# Patient Record
Sex: Female | Born: 1975 | Race: White | Hispanic: Yes | Marital: Single | Smoking: Never smoker
Health system: Southern US, Community
[De-identification: ages and names within clinical notes are randomized; demographics above are authoritative.]

## PROBLEM LIST (undated history)

## (undated) DIAGNOSIS — R87629 Unspecified abnormal cytological findings in specimens from vagina: Secondary | ICD-10-CM

## (undated) HISTORY — DX: Unspecified abnormal cytological findings in specimens from vagina: R87.629

---

## 2012-12-18 ENCOUNTER — Emergency Department: Payer: Self-pay | Admitting: Emergency Medicine

## 2012-12-18 LAB — URINALYSIS, COMPLETE
Bilirubin,UR: NEGATIVE
Glucose,UR: NEGATIVE mg/dL (ref 0–75)
Nitrite: NEGATIVE
Protein: 30
Squamous Epithelial: 11

## 2012-12-18 LAB — CBC
HCT: 41.6 % (ref 35.0–47.0)
HGB: 14 g/dL (ref 12.0–16.0)
MCHC: 33.6 g/dL (ref 32.0–36.0)
MCV: 88 fL (ref 80–100)
RBC: 4.71 10*6/uL (ref 3.80–5.20)
WBC: 8.3 10*3/uL (ref 3.6–11.0)

## 2012-12-20 ENCOUNTER — Emergency Department: Payer: Self-pay | Admitting: Emergency Medicine

## 2012-12-20 LAB — URINALYSIS, COMPLETE
Bilirubin,UR: NEGATIVE
Nitrite: NEGATIVE
Protein: 100
RBC,UR: 1642 /HPF (ref 0–5)
Specific Gravity: 1.026 (ref 1.003–1.030)
Squamous Epithelial: 4

## 2012-12-20 LAB — CBC
HGB: 14.1 g/dL (ref 12.0–16.0)
MCHC: 33.9 g/dL (ref 32.0–36.0)
MCV: 89 fL (ref 80–100)
Platelet: 251 10*3/uL (ref 150–440)
RDW: 12.4 % (ref 11.5–14.5)
WBC: 10.2 10*3/uL (ref 3.6–11.0)

## 2012-12-20 LAB — HCG, QUANTITATIVE, PREGNANCY: Beta Hcg, Quant.: 1595 m[IU]/mL — ABNORMAL HIGH

## 2012-12-21 LAB — PATHOLOGY REPORT

## 2014-01-27 ENCOUNTER — Ambulatory Visit: Payer: Self-pay | Admitting: Advanced Practice Midwife

## 2014-02-07 ENCOUNTER — Ambulatory Visit: Payer: Self-pay | Admitting: Advanced Practice Midwife

## 2014-03-10 ENCOUNTER — Ambulatory Visit: Payer: Self-pay | Admitting: Advanced Practice Midwife

## 2014-03-21 DIAGNOSIS — O24410 Gestational diabetes mellitus in pregnancy, diet controlled: Secondary | ICD-10-CM | POA: Insufficient documentation

## 2020-10-21 ENCOUNTER — Encounter: Payer: Self-pay | Admitting: Physician Assistant

## 2020-10-21 ENCOUNTER — Ambulatory Visit: Payer: Self-pay

## 2020-10-21 ENCOUNTER — Other Ambulatory Visit: Payer: Self-pay

## 2020-10-21 ENCOUNTER — Ambulatory Visit (LOCAL_COMMUNITY_HEALTH_CENTER): Payer: Self-pay | Admitting: Physician Assistant

## 2020-10-21 VITALS — BP 117/76 | Ht 61.0 in | Wt 205.6 lb

## 2020-10-21 DIAGNOSIS — Z3046 Encounter for surveillance of implantable subdermal contraceptive: Secondary | ICD-10-CM

## 2020-10-21 DIAGNOSIS — Z3009 Encounter for other general counseling and advice on contraception: Secondary | ICD-10-CM

## 2020-10-21 DIAGNOSIS — Z0000 Encounter for general adult medical examination without abnormal findings: Secondary | ICD-10-CM

## 2020-10-21 DIAGNOSIS — Z30011 Encounter for initial prescription of contraceptive pills: Secondary | ICD-10-CM

## 2020-10-21 MED ORDER — NORGESTIMATE-ETH ESTRADIOL 0.25-35 MG-MCG PO TABS: 1.0000 | ORAL_TABLET | Freq: Every day | ORAL | 13 refills | Status: DC

## 2020-10-21 NOTE — Progress Notes (Signed)
Pt here for physical and Nexplanon removal. Pt reports she is interested in switching to birth control pills. Nexplanon placed at [facility] 10/16/2017 per [facility] records and per pt. Pt reports no issues with Nexplanon. Pt reports last sex 10/14/2020 without a condom and counseled pt that sperm can live in the body for up to 7 days and as soon as nexplanon is removed the hormone that was there preventing pregnancy will be gone and there could be a potential risk for pregnancy with her last unprotected sex and pt states understanding and would like to discuss this with the provider but is still thinking she would like the removal today and switch to pills even with risk of pregnancy. Sadie Haber, PA notified. RN counseling for Nexplanon removal completed and consent form reviewed and signed by pt. Pt states understanding of counseling.

## 2020-10-21 NOTE — Progress Notes (Signed)
Pt received Sprintec #12 packs per provider order due to expiration date. Counseled pt regarding expiration date of 11/09/2021 and that she should finish pills prior to expiration date but that if for some reason she still has pills on or after 11/09/2021 she should not take them and give [place] a call so she can RTC for more pills and pt states understanding of counseling. Pt aware that we still owe her a pack of pills and that she needs to give [place] a call when she first opens up her last pack of pills so she can RTC prior to running out of pills and pt states understanding. RN counseling for birth control pills completed and consent form reviewed and signed by pt. Pt aware to either not have sex or use condoms every time for the next 10 days and to do a home pregnancy test in 1 week per Sadie Haber, PA verbal order, and if pregnancy test is positive to stop her birth control pills and give [place] a call so she can RTC, and if it's negative she should be fine to continue birth control pills. Counseled pt per provider orders and pt states understanding of counseling. Provider orders completed.

## 2020-10-21 NOTE — Progress Notes (Signed)
[facility] DEPARTMENT [facility] [address] Main Number: [phone]    Family Planning Visit- Initial Visit  Subjective:  Sabrina Wagner is a 45 y.o.  [id number]   being seen today for an initial well woman visit and to discuss family planning options.  She is currently using Nexplanon for pregnancy prevention. Patient reports she does not want a pregnancy in the next year.  Patient has the following medical conditions has GDM, class A1 on their problem list.  Chief Complaint  Patient presents with  . Contraception    Physical and Nexplanon removal and switch birth control    Patient reports that she would like to have her Nexplanon removed and start OCs.  Reports that she was previously on OCs with 2 colors without problems but does not remember the name of the pills or the colors of the pills.  Denies problems with Nexplanon. States that she has a PCP at [facility] and they monitor blood sugar and have discussed when she needs to start getting mammograms.  Per chart review, pap due in 2024.  Patient denies any concerns today,   Body mass index is 38.85 kg/m. - Patient is eligible for diabetes screening based on BMI and age >62?  yes HA1C ordered? No, patient is monitored by PCP regularly per her report.  Patient reports 1  partner/s in last year. Desires STI screening?  No - patient declines.  Has patient been screened once for HCV in the past?  No  No results found for: HCVAB  Does the patient have current drug use (including MJ), have a partner with drug use, and/or has been incarcerated since last result? No  If yes-- Screen for HCV through [facility]   Does the patient meet criteria for HBV testing? No  Criteria:  -Household, sexual or needle sharing contact with HBV -History of drug use -HIV positive -Those with known Hep C   Health Maintenance Due  Topic Date Due  . Hepatitis C Screening  Never done  . COVID-19 Vaccine  (1) Never done  . HIV Screening  Never done  . TETANUS/TDAP  Never done  . PAP SMEAR-Modifier  Never done  . INFLUENZA VACCINE  Never done    Review of Systems  All other systems reviewed and are negative.   The following portions of the patient's history were reviewed and updated as appropriate: allergies, current medications, past family history, past medical history, past social history, past surgical history and problem list. Problem list updated.   See flowsheet for other program required questions.  Objective:   Vitals:   10/21/20 0911  BP: 117/76  Weight: 205 lb 9.6 oz (93.3 kg)  Height: 5\' 1"  (1.549 m)    Physical Exam Vitals and nursing note reviewed.  Constitutional:      General: She is not in acute distress.    Appearance: Normal appearance.  HENT:     Head: Normocephalic and atraumatic.     Mouth/Throat:     Mouth: Mucous membranes are moist.     Pharynx: Oropharynx is clear. No oropharyngeal exudate or posterior oropharyngeal erythema.  Eyes:     Conjunctiva/sclera: Conjunctivae normal.  Neck:     Thyroid: No thyroid mass, thyromegaly or thyroid tenderness.  Cardiovascular:     Rate and Rhythm: Normal rate and regular rhythm.  Pulmonary:     Effort: Pulmonary effort is normal.     Breath sounds: Normal breath sounds.  Chest:  Breasts:     Right: Normal. No mass, nipple discharge, skin change, tenderness, axillary adenopathy or supraclavicular adenopathy.     Left: Normal. No mass, nipple discharge, skin change, tenderness, axillary adenopathy or supraclavicular adenopathy.    Abdominal:     Palpations: Abdomen is soft. There is no mass.     Tenderness: There is no abdominal tenderness. There is no guarding or rebound.  Musculoskeletal:     Cervical back: Neck supple. No tenderness.  Lymphadenopathy:     Cervical: No cervical adenopathy.     Upper Body:     Right upper body: No supraclavicular, axillary or pectoral adenopathy.     Left upper  body: No supraclavicular, axillary or pectoral adenopathy.  Skin:    General: Skin is warm and dry.     Findings: No bruising, erythema, lesion or rash.  Neurological:     Mental Status: She is alert and oriented to person, place, and time.  Psychiatric:        Mood and Affect: Mood normal.        Behavior: Behavior normal.        Thought Content: Thought content normal.        Judgment: Judgment normal.       Assessment and Plan:  Sabrina Wagner is a 45 y.o. female presenting to the [facility] Department for an initial well woman exam/family planning visit  Contraception counseling: Reviewed all forms of birth control options in the tiered based approach. available including abstinence; over the counter/barrier methods; hormonal contraceptive medication including pill, patch, ring, injection,contraceptive implant, ECP; hormonal and nonhormonal IUDs; permanent sterilization options including vasectomy and the various tubal sterilization modalities. Risks, benefits, and typical effectiveness rates were reviewed.  Questions were answered.  Written information was also given to the patient to review.  Patient desires to change to OCs, this was prescribed for patient. She will follow up in  1 year and prn for surveillance.  She was told to call with any further questions, or with any concerns about this method of contraception.  Emphasized use of condoms 100% of the time for STI prevention.  Patient was not a candidate for ECP today.  1. Encounter for counseling regarding contraception Reviewed with patient that her Nexplanon would be effective for another year if she desires to continue with this as her BCM. Enc patient to use condoms with all sex for 1 week after removal and especially if late/missed OCs.  2. Nexplanon removal Nexplanon Removal Patient identified, informed consent performed, consent signed.   Appropriate time out taken. Nexplanon site identified.  Area  prepped in usual sterile fashon. 3 ml of 1% lidocaine with Epinephrine was used to anesthetize the area at the distal end of the implant and along implant site. A small stab incision was made right beside the implant on the distal portion.  The Nexplanon rod was grasped manually and removed without difficulty.  There was minimal blood loss. There were no complications.  Steri-strips were applied over the small incision.  A pressure bandage was applied to reduce any bruising.  The patient tolerated the procedure well and was given post procedure instructions.   Nexplanon:   Counseled patient to take OTC analgesic starting as soon as lidocaine starts to wear off and take regularly for at least 48 hr to decrease discomfort.  Specifically to take with food or milk to decrease stomach upset and for IB 600 mg (3 tablets) every 6 hrs; IB 800 mg (  4 tablets) every 8 hrs; or Aleve 2 tablets every 12 hrs.    3. Well woman exam (no gynecological exam) Reviewed with patient healthy habits to maintain general health. Enc MVI 1 po daily. Enc to establish with/ follow up with PCP for primary care concerns, age appropriate screenings and illness.  4. OCP (oral contraceptive pills) initiation May start Sprintec 28 d #13 1 po daily at the same time each day today. Enc to use condoms for 1 week and check a pregnancy test at home in 1 week, call and RTC if that test is positive. - norgestimate-ethinyl estradiol (SPRINTEC 28) 0.25-35 MG-MCG tablet; Take 1 tablet by mouth daily.  Dispense: 28 tablet; Refill: 13     Return in about 1 year (around 10/21/2021) for RP and prn.  No future appointments.  Matt Holmes, PA

## 2021-08-31 ENCOUNTER — Other Ambulatory Visit: Payer: Self-pay

## 2021-08-31 ENCOUNTER — Ambulatory Visit (LOCAL_COMMUNITY_HEALTH_CENTER): Payer: Self-pay

## 2021-08-31 VITALS — BP 126/79 | Ht 61.0 in | Wt 206.5 lb

## 2021-08-31 DIAGNOSIS — Z3009 Encounter for other general counseling and advice on contraception: Secondary | ICD-10-CM

## 2021-08-31 DIAGNOSIS — Z3041 Encounter for surveillance of contraceptive pills: Secondary | ICD-10-CM

## 2021-08-31 MED ORDER — NORGESTIMATE-ETH ESTRADIOL 0.25-35 MG-MCG PO TABS: 1.0000 | ORAL_TABLET | Freq: Every day | ORAL | 0 refills | Status: DC

## 2021-08-31 NOTE — Progress Notes (Signed)
In Nurse Clinic for ocp refill (sprintec). Pt reports she has 4 pills in her current pack which is her last pack of ocp. Sprintec #12 packs were dispensed at pt's annual visit on 10/21/2020. Due for annual physical 10/21/2021. Admits to missing 2 pills approx 3 mo ago. Takes ocp same time daily. RN counseled pt on abstaining from sex or using condoms as back up if misses ocp. Consult Beatris Si, PA who gives order for pt to have 2 packs Sprintec until annual physical.  RN dispensed Sprintec #2 packs today. Pt reminded to contact [facility]  for annual physical when begins last pack of ocp. Ocp pack labeled with this message. Questions answered and reports understanding. Juliene Pina, interpreter today. Jerel Shepherd, RN

## 2021-08-31 NOTE — Progress Notes (Signed)
Consulted by RN re: patient situation.  Reviewed RN note and agree that it reflects our discussion and my recommendations. 

## 2021-10-13 ENCOUNTER — Ambulatory Visit (LOCAL_COMMUNITY_HEALTH_CENTER): Payer: Self-pay

## 2021-10-13 ENCOUNTER — Other Ambulatory Visit: Payer: Self-pay

## 2021-10-13 DIAGNOSIS — Z3009 Encounter for other general counseling and advice on contraception: Secondary | ICD-10-CM

## 2021-10-13 NOTE — Progress Notes (Signed)
In Nurse Clinic today. Dot Lanes, interpreter. RN asked pt the purpose of her visit today and she responded that she was here to make her annual appt which she did with the clerk before seeing RN. Physical scheduled 10/22/2021. Pt reports the clerk told her she could "wait around" for the nurse to give her more birth control pills before her physical. RN explained  to pt that she has received #14 packs of ocp since last physical 10/21/2020 and she will need to see the provider at her physical before receiving any additional pills. Pt nodded head and said yes. Pt states she has pills for this week and two more weeks. Per pt, will not run out of pills until 10/29/2021 (approx). Josie Saunders, RN

## 2021-10-22 ENCOUNTER — Ambulatory Visit (LOCAL_COMMUNITY_HEALTH_CENTER): Payer: Self-pay | Admitting: Family Medicine

## 2021-10-22 ENCOUNTER — Other Ambulatory Visit: Payer: Self-pay

## 2021-10-22 ENCOUNTER — Encounter: Payer: Self-pay | Admitting: Family Medicine

## 2021-10-22 VITALS — BP 126/71 | Ht 62.0 in | Wt 208.4 lb

## 2021-10-22 DIAGNOSIS — Z3041 Encounter for surveillance of contraceptive pills: Secondary | ICD-10-CM

## 2021-10-22 DIAGNOSIS — Z3009 Encounter for other general counseling and advice on contraception: Secondary | ICD-10-CM

## 2021-10-22 DIAGNOSIS — Z0000 Encounter for general adult medical examination without abnormal findings: Secondary | ICD-10-CM

## 2021-10-22 DIAGNOSIS — R1901 Right upper quadrant abdominal swelling, mass and lump: Secondary | ICD-10-CM

## 2021-10-22 MED ORDER — NORGESTIMATE-ETH ESTRADIOL 0.25-35 MG-MCG PO TABS: 1.0000 | ORAL_TABLET | Freq: Every day | ORAL | 12 refills | Status: AC

## 2021-10-22 NOTE — Progress Notes (Signed)
Patient here for PE. PCP listen given. Condoms given. BCCP information given. OCP 6 packs given, Lot #[id number], Expiration date:04/09/2023.

## 2021-10-22 NOTE — Progress Notes (Signed)
[facility] Number: [phone]  Family Planning Visit- Repeat Yearly Visit  Subjective:  Sabrina Wagner is a 46 y.o. [id number]  being seen today for an annual wellness visit and to discuss contraception options.   The patient is currently using Oral Contraceptive for pregnancy prevention. Patient does not want a pregnancy in the next year. Patient has the following medical problems: has GDM, class A1 on their problem list.  Chief Complaint  Patient presents with   Annual Exam    Contraception OCP    Patient reports here for PE and [facility]   Patient denies any need for STI testing.     See flowsheet for other program required questions.   Body mass index is 38.12 kg/m. - Patient is eligible for diabetes screening based on BMI and age >20?  yes HA1C ordered? Patient preferred testing at PCP  Patient reports 1 of partners in last year. Desires STI screening?  No - declined    Has patient been screened once for HCV in the past?  No  No results found for: HCVAB  Does the patient have current of drug use, have a partner with drug use, and/or has been incarcerated since last result? No  If yes-- Screen for HCV through [facility]   Does the patient meet criteria for HBV testing? No  Criteria:  -Household, sexual or needle sharing contact with HBV -History of drug use -HIV positive -Those with known Hep C   Health Maintenance Due  Topic Date Due   COVID-19 Vaccine (1) Never done   HIV Screening  Never done   Hepatitis C Screening  Never done   TETANUS/TDAP  Never done   PAP SMEAR-Modifier  Never done   COLONOSCOPY (Pts 45-22yrs Insurance coverage will need to be confirmed)  Never done   INFLUENZA VACCINE  Never done    Review of Systems  Constitutional:  Negative for chills, fever, malaise/fatigue and weight loss.  HENT:  Negative for congestion, hearing loss and sore throat.   Eyes:   Negative for blurred vision, double vision and photophobia.  Respiratory:  Negative for shortness of breath.   Cardiovascular:  Negative for chest pain.  Gastrointestinal:  Negative for abdominal pain, blood in stool, constipation, diarrhea, heartburn, nausea and vomiting.  Genitourinary:  Negative for dysuria and frequency.  Musculoskeletal:  Negative for back pain, joint pain and neck pain.  Skin:  Negative for itching and rash.  Neurological:  Negative for dizziness, weakness and headaches.  Endo/Heme/Allergies:  Does not bruise/bleed easily.  Psychiatric/Behavioral:  Negative for depression, substance abuse and suicidal ideas.    The following portions of the patient's history were reviewed and updated as appropriate: allergies, current medications, past family history, past medical history, past social history, past surgical history and problem list. Problem list updated.  Objective:   Vitals:   10/22/21 0849  BP: 126/71  Weight: 208 lb 6.4 oz (94.5 kg)  Height: 5\' 2"  (1.575 m)    Physical Exam Vitals and nursing note reviewed.  Constitutional:      Appearance: Normal appearance.  HENT:     Head: Normocephalic and atraumatic.     Mouth/Throat:     Mouth: Mucous membranes are moist.     Dentition: Abnormal dentition. Dental caries present. No dental tenderness.     Pharynx: No oropharyngeal exudate or posterior oropharyngeal erythema.     Comments: Teeth missing  Eyes:  General: No scleral icterus. Neck:     Thyroid: No thyroid mass, thyromegaly or thyroid tenderness.  Cardiovascular:     Rate and Rhythm: Normal rate and regular rhythm.     Pulses: Normal pulses.     Heart sounds: Normal heart sounds.  Pulmonary:     Effort: Pulmonary effort is normal.     Breath sounds: Normal breath sounds.  Chest:  Breasts:    Right: Normal.     Left: Normal.     Comments: Breasts:        Right: Normal. No swelling, mass, nipple discharge, skin change or tenderness.         Left: Normal. No swelling, mass, nipple discharge, skin change or tenderness.   Abdominal:     General: Abdomen is flat. Bowel sounds are normal.     Palpations: Abdomen is soft. There is mass.     Tenderness: There is abdominal tenderness in the right upper quadrant. There is guarding. There is no rebound.       Comments: Tender to palpation  Genitourinary:    Comments: Deferred  Musculoskeletal:        General: Normal range of motion.     Cervical back: Normal range of motion and neck supple.  Skin:    General: Skin is warm and dry.  Neurological:     General: No focal deficit present.     Mental Status: She is alert.      Assessment and Plan:  Sabrina Wagner is a 46 y.o. female [id number] presenting to the [facility] Department for an yearly wellness and contraception visit    1. Well woman exam (no gynecological exam) Well woman exam  Pap due 2024 CBE done today  Referred to BCCCP for mammogram d/t family hx of BCA.    2.  Family Planning Counseling   Contraception counseling: Reviewed all forms of birth control options in the tiered based approach. available including abstinence; over the counter/barrier methods; hormonal contraceptive medication including pill, patch, ring, injection,contraceptive implant, ECP; hormonal and nonhormonal IUDs; permanent sterilization options including vasectomy and the various tubal sterilization modalities. Risks, benefits, and typical effectiveness rates were reviewed.  Questions were answered.  Written information was also given to the patient to review.  Patient desires OCP, this was prescribed for patient. She will follow up in  6 months  for surveillance.    was told to call with any further questions, or with any concerns about this method of contraception.  Emphasized use of condoms 100% of the time for STI prevention.  Patient was not offered ECP based on patient preference   3. Surveillance of contraceptive  pill Pt desires to continue OCPs,  denies any problems, concerns, miss or late pills.    Pt to call if problems or concerns.   - norgestimate-ethinyl estradiol (ORTHO-CYCLEN) 0.25-35 MG-MCG tablet; Take 1 tablet by mouth daily.  Dispense: 28 tablet; Refill:    4. RUQ abdominal mass Pt reports tenderness on exam and that she has had pain for ~ 6 months, but has not said anything to her PCP because she could not get an appointment.  Mass felt on exam.   Will refer to PCP at [facility] for  RUQ abdominal pain.    Return in about 1 year (around 10/22/2022) for annual well woman exam.  No future appointments.  [facility] agency interpreter used for Spanish interpretation.      Junious Dresser, FNP

## 2021-10-26 DIAGNOSIS — R1901 Right upper quadrant abdominal swelling, mass and lump: Secondary | ICD-10-CM | POA: Insufficient documentation

## 2022-01-10 ENCOUNTER — Other Ambulatory Visit: Payer: Self-pay

## 2022-01-10 DIAGNOSIS — Z1231 Encounter for screening mammogram for malignant neoplasm of breast: Secondary | ICD-10-CM

## 2022-01-10 DIAGNOSIS — Z1211 Encounter for screening for malignant neoplasm of colon: Secondary | ICD-10-CM

## 2022-01-12 ENCOUNTER — Ambulatory Visit: Payer: Self-pay | Attending: Hematology and Oncology | Admitting: *Deleted

## 2022-01-12 ENCOUNTER — Ambulatory Visit
Admission: RE | Admit: 2022-01-12 | Discharge: 2022-01-12 | Disposition: A | Discharge status: Home / Self Care | Payer: Self-pay | Source: Ambulatory Visit | Attending: Obstetrics and Gynecology | Admitting: Obstetrics and Gynecology

## 2022-01-12 VITALS — BP 131/73 | HR 79 | Resp 18 | Wt 212.0 lb

## 2022-01-12 DIAGNOSIS — Z1239 Encounter for other screening for malignant neoplasm of breast: Secondary | ICD-10-CM

## 2022-01-12 DIAGNOSIS — Z1231 Encounter for screening mammogram for malignant neoplasm of breast: Secondary | ICD-10-CM | POA: Insufficient documentation

## 2022-01-12 NOTE — Progress Notes (Signed)
Ms. Sidda Humm is a 46 y.o. female who presents to [facility] clinic today with no complaints.  ?  ?Pap Smear: Pap smear not completed today. Last Pap smear was 10/14/2017 at the [facility] clinic and was normal with negative HPV per Hart Carwin from the [facility]. Per patient has history of an abnormal Pap smear in 2007 that was initially sent for a colposcopy at [facility] that was cancelled and a repeat Pap smear was completed for follow up that was negative. Per patient all Pap smears have been normal since and has had at least three. Last Pap smear result is not available in Epic. ?  ?Physical exam: ?Breasts ?Breasts symmetrical. No skin abnormalities bilateral breasts. No nipple retraction bilateral breasts. No nipple discharge bilateral breasts. No lymphadenopathy. No lumps palpated bilateral breasts. No complaints of pain or tenderness on exam.    ? ?Pelvic/Bimanual ?Pap is not indicated today per BCCCP guidelines. ?  ?Smoking History: ?Patient has never smoked. ?  ?Patient Navigation: ?Patient education provided. Access to services provided for patient through [employer] program. Spanish interpreter Kristeen Mans from [facility] provided.  ? ?Colorectal Cancer Screening: ?Per patient has never had colonoscopy completed. FIT Test given to patient to complete. No complaints today.  ?  ?Breast and Cervical Cancer Risk Assessment: ?Patient has family history of a paternal aunt having breast cancer. Patient has no known genetic mutations or history of radiation treatment to the chest before age 68. Patient does not have history of cervical dysplasia, immunocompromised, or DES exposure in-utero. ? ?Risk Assessment   ? ? Risk Scores   ? ?   01/12/2022  ? Last edited by: Lesle Chris, RN  ? 5-year risk: 0.6 %  ? Lifetime risk: 7.5 %  ? ?  ?  ? ?  ? ? ?A: ?BCCCP exam without pap smear ?No complaints. ? ?P: ?Referred patient to the [facility] for a  screening mammogram. Appointment scheduled Wednesday, January 12, 2022 at 1140. ? ?Priscille Heidelberg, RN ?01/12/2022 9:50 AM   ?

## 2022-01-12 NOTE — Patient Instructions (Signed)
Explained breast self awareness with Josiah Lobo. Patient did not need a Pap smear today due to last Pap smear and HPV typing was 10/14/2017. Let her know BCCCP will cover Pap smears and HPV typing every 5 years unless has a history of abnormal Pap smears. Referred patient to the [facility] for a screening mammogram. Appointment scheduled Wednesday, January 12, 2022 at 1140. Patient aware of appointment and will be there. Let patient know Delford Field will follow up with her within the next couple weeks with results of mammogram by letter or phone. Irais Mottram verbalized understanding. ? ?Jaziya Obarr, Kathaleen Maser, RN ?9:51 AM ? ? ? ? ?

## 2022-04-13 ENCOUNTER — Ambulatory Visit (LOCAL_COMMUNITY_HEALTH_CENTER): Payer: Self-pay

## 2022-04-13 VITALS — BP 117/76 | Ht 62.0 in | Wt 213.0 lb

## 2022-04-13 DIAGNOSIS — Z3041 Encounter for surveillance of contraceptive pills: Secondary | ICD-10-CM

## 2022-04-13 DIAGNOSIS — Z3009 Encounter for other general counseling and advice on contraception: Secondary | ICD-10-CM

## 2022-04-13 MED ORDER — NORGESTIMATE-ETH ESTRADIOL 0.25-35 MG-MCG PO TABS: 1.0000 | ORAL_TABLET | Freq: Every day | ORAL | 0 refills | Status: DC

## 2022-04-13 NOTE — Progress Notes (Signed)
In Nurse clinic for ocp refill (Orthocyclen). Reports 2 weeks left in current last pack of ocp. Denies missing any pills and takes same time daily. Voices no concerns today.   Orthocyclen # 6 packs dispensed per order by Elveria Rising, FNP dated 10/22/2021. Advised to contact [facility] to schedule appt when begins last pack. Pack labeled with this message. Questions answered and reports understanding. Lang line [phone] served as interpreter today. Jerel Shepherd, RN

## 2022-05-10 DIAGNOSIS — E119 Type 2 diabetes mellitus without complications: Secondary | ICD-10-CM

## 2022-05-10 HISTORY — DX: Type 2 diabetes mellitus without complications: E11.9

## 2022-05-27 ENCOUNTER — Emergency Department: Payer: Self-pay

## 2022-05-27 ENCOUNTER — Other Ambulatory Visit: Payer: Self-pay

## 2022-05-27 ENCOUNTER — Emergency Department
Admission: EM | Admit: 2022-05-27 | Discharge: 2022-05-27 | Disposition: A | Discharge status: Home / Self Care | Payer: Self-pay | Attending: Emergency Medicine | Admitting: Emergency Medicine

## 2022-05-27 DIAGNOSIS — K5792 Diverticulitis of intestine, part unspecified, without perforation or abscess without bleeding: Secondary | ICD-10-CM | POA: Insufficient documentation

## 2022-05-27 DIAGNOSIS — D72829 Elevated white blood cell count, unspecified: Secondary | ICD-10-CM | POA: Insufficient documentation

## 2022-05-27 LAB — CBC WITH DIFFERENTIAL/PLATELET
Abs Immature Granulocytes: 0.13 10*3/uL — ABNORMAL HIGH (ref 0.00–0.07)
Basophils Absolute: 0.1 10*3/uL (ref 0.0–0.1)
Basophils Relative: 0 %
Eosinophils Absolute: 0.1 10*3/uL (ref 0.0–0.5)
Eosinophils Relative: 1 %
HCT: 43.8 % (ref 36.0–46.0)
Hemoglobin: 14.7 g/dL (ref 12.0–15.0)
Immature Granulocytes: 1 %
Lymphocytes Relative: 15 %
Lymphs Abs: 2.6 10*3/uL (ref 0.7–4.0)
MCH: 29.8 pg (ref 26.0–34.0)
MCHC: 33.6 g/dL (ref 30.0–36.0)
MCV: 88.8 fL (ref 80.0–100.0)
Monocytes Absolute: 0.9 10*3/uL (ref 0.1–1.0)
Monocytes Relative: 5 %
Neutro Abs: 13.8 10*3/uL — ABNORMAL HIGH (ref 1.7–7.7)
Neutrophils Relative %: 78 %
Platelets: 311 10*3/uL (ref 150–400)
RBC: 4.93 MIL/uL (ref 3.87–5.11)
RDW: 11.9 % (ref 11.5–15.5)
WBC: 17.5 10*3/uL — ABNORMAL HIGH (ref 4.0–10.5)
nRBC: 0 % (ref 0.0–0.2)

## 2022-05-27 LAB — COMPREHENSIVE METABOLIC PANEL
ALT: 58 U/L — ABNORMAL HIGH (ref 0–44)
AST: 33 U/L (ref 15–41)
Albumin: 4.3 g/dL (ref 3.5–5.0)
Alkaline Phosphatase: 128 U/L — ABNORMAL HIGH (ref 38–126)
Anion gap: 10 (ref 5–15)
BUN: 8 mg/dL (ref 6–20)
CO2: 26 mmol/L (ref 22–32)
Calcium: 9.4 mg/dL (ref 8.9–10.3)
Chloride: 99 mmol/L (ref 98–111)
Creatinine, Ser: 0.62 mg/dL (ref 0.44–1.00)
GFR, Estimated: >60 mL/min (ref >60)
Glucose, Bld: 168 mg/dL — ABNORMAL HIGH (ref 70–99)
Potassium: 3.8 mmol/L (ref 3.5–5.1)
Sodium: 135 mmol/L (ref 135–145)
Total Bilirubin: 0.9 mg/dL (ref 0.3–1.2)
Total Protein: 8.2 g/dL — ABNORMAL HIGH (ref 6.5–8.1)

## 2022-05-27 LAB — URINALYSIS, ROUTINE W REFLEX MICROSCOPIC
Bilirubin Urine: NEGATIVE
Glucose, UA: NEGATIVE mg/dL
Ketones, ur: NEGATIVE mg/dL
Nitrite: NEGATIVE
Protein, ur: NEGATIVE mg/dL
Specific Gravity, Urine: 1.01 (ref 1.005–1.030)
pH: 7 (ref 5.0–8.0)

## 2022-05-27 LAB — POC URINE PREG, ED: Preg Test, Ur: NEGATIVE

## 2022-05-27 LAB — LIPASE, BLOOD: Lipase: 28 U/L (ref 11–51)

## 2022-05-27 MED ORDER — TRAMADOL HCL 50 MG PO TABS: 50.0000 mg | ORAL_TABLET | Freq: Four times a day (QID) | ORAL | 0 refills | Status: AC | PRN

## 2022-05-27 MED ORDER — FENTANYL CITRATE PF 50 MCG/ML IJ SOSY
50.0000 ug | PREFILLED_SYRINGE | Freq: Once | INTRAMUSCULAR | Status: AC
  Administered 2022-05-27: 50 ug via INTRAVENOUS
  Filled 2022-05-27: qty 1

## 2022-05-27 MED ORDER — AMOXICILLIN-POT CLAVULANATE 875-125 MG PO TABS: 1.0000 | ORAL_TABLET | Freq: Three times a day (TID) | ORAL | 0 refills | Status: AC

## 2022-05-27 MED ORDER — IOHEXOL 300 MG/ML  SOLN
100.0000 mL | Freq: Once | INTRAMUSCULAR | Status: AC | PRN
  Administered 2022-05-27: 100 mL via INTRAVENOUS

## 2022-05-27 MED ORDER — SODIUM CHLORIDE 0.9 % IV BOLUS
1000.0000 mL | Freq: Once | INTRAVENOUS | Status: AC
  Administered 2022-05-27: 1000 mL via INTRAVENOUS

## 2022-05-27 NOTE — Discharge Instructions (Addendum)
Please talk to your primary care doctor about further imaging of your left kidney. It was found to have a lesion on it on CT today.  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 Hoy se encontr que tena una lesin en la tomografa computarizada.

## 2022-05-27 NOTE — ED Notes (Signed)
EDP at bedside  

## 2022-05-27 NOTE — ED Provider Notes (Signed)
[facility] Provider Note    Event Date/Time   First MD Initiated Contact with Patient 05/27/22 [phone]     (approximate)   History   Abdominal Pain   HPI  Sabrina Wagner is a 46 y.o. female who presents to the emergency department today because of concerns for abdominal pain.  Located in the lower abdomen.  Has been present for roughly 1 week.  Has been fairly constant.  Patient has not noticed any change in defecation.  Has had some discomfort with fullness of the bladder but denies any dysuria or bad odor to her urine.  Patient denies similar symptoms in the past.     Physical Exam   Triage Vital Signs: ED Triage Vitals  Enc Vitals Group     BP 05/27/22 0142 127/63     Pulse Rate 05/27/22 0142 (!) 110     Resp 05/27/22 0142 18     Temp 05/27/22 0142 98.8 F (37.1 C)     Temp Source 05/27/22 0142 Oral     SpO2 05/27/22 0142 96 %     Weight 05/27/22 0148 200 lb (90.7 kg)     Height 05/27/22 0148 5\' 2"  (1.575 m)     Head Circumference --      Peak Flow --      Pain Score 05/27/22 0147 8     Pain Loc --      Pain Edu? --      Excl. in GC? --     Most recent vital signs: Vitals:   05/27/22 0430 05/27/22 0500  BP: (!) 119/59 120/70  Pulse: 87 85  Resp: 16 18  Temp:    SpO2: 96% 95%   General: Awake, alert, oriented. CV:  Good peripheral perfusion. Regular rate and rhythm. Resp:  Normal effort. Lungs clear. Abd:  No distention. Tender to palpation in lower abdomen.  ED Results / Procedures / Treatments   Labs (all labs ordered are listed, but only abnormal results are displayed) Labs Reviewed  CBC WITH DIFFERENTIAL/PLATELET - Abnormal; Notable for the following components:      Result Value   WBC 17.5 (*)    Neutro Abs 13.8 (*)    Abs Immature Granulocytes 0.13 (*)    All other components within normal limits  COMPREHENSIVE METABOLIC PANEL - Abnormal; Notable for the following components:   Glucose, Bld 168 (*)    Total  Protein 8.2 (*)    ALT 58 (*)    Alkaline Phosphatase 128 (*)    All other components within normal limits  URINALYSIS, ROUTINE W REFLEX MICROSCOPIC - Abnormal; Notable for the following components:   Color, Urine YELLOW (*)    APPearance HAZY (*)    Hgb urine dipstick SMALL (*)    Leukocytes,Ua LARGE (*)    Bacteria, UA RARE (*)    All other components within normal limits  POC URINE PREG, ED - Normal  LIPASE, BLOOD     EKG  None   RADIOLOGY I independently interpreted and visualized the CT abd/pel. My interpretation: Diverticulitis Radiology interpretation:  IMPRESSION:  1. Acute sigmoid diverticulitis. No evidence of perforation or  abscess.  2. Hepatic steatosis.  3. 1.3 cm intermediate density lesion in the left kidney,  indeterminate. Recommend further evaluation with non-emergent renal  ultrasound.     PROCEDURES:  Critical Care performed: No  Procedures   MEDICATIONS ORDERED IN ED: Medications - No data to display   IMPRESSION / MDM /  ASSESSMENT AND PLAN / ED COURSE  I reviewed the triage vital signs and the nursing notes.                              Differential diagnosis includes, but is not limited to, UTI, diverticulitis, appendicitis.  Patient's presentation is most consistent with acute presentation with potential threat to life or bodily function.  Patient presented to the emergency department today because of concerns for abdominal pain for roughly 1 week.  On exam she was tender in the lower abdomen.  Blood work with leukocytosis.  Given tenderness and leukocytosis CT scan was ordered.  This was consistent with diverticulitis.  No perforation or abscess.  I discussed this finding with the patient.  We will plan on discharging with antibiotics and pain medication.  Also discussed finding of kidney lesion that will need to be followed up with outpatient imaging.  FINAL CLINICAL IMPRESSION(S) / ED DIAGNOSES   Final diagnoses:  Diverticulitis        Note:  This document was prepared using Dragon voice recognition software and may include unintentional dictation errors.    Phineas Semen, MD 05/27/22 [phone]

## 2022-05-27 NOTE — ED Notes (Signed)
Assisted pt to toilet 

## 2022-05-27 NOTE — ED Triage Notes (Signed)
Pt presents to ER from home with c/o lower abd pain x6 days.  Pt states she had some pain during her period, but when her period ended, states the pain was still there.  Pt denies any new vaginal bleeding.  Pt does endorse some increased urinary frequency.  Pt denies n/v/d.  Pt is A&O x4 at this time in NAD in triage.

## 2022-07-21 IMAGING — MG MM DIGITAL SCREENING BILAT W/ TOMO AND CAD
8 series · 8 of 24 positions shown · non-contrast
Comparison: None.

CLINICAL DATA: Screening.

EXAM:
DIGITAL SCREENING BILATERAL MAMMOGRAM WITH TOMOSYNTHESIS AND CAD
TECHNIQUE: Bilateral screening digital craniocaudal and mediolateral oblique
mammograms were obtained. Bilateral screening digital breast
tomosynthesis was performed. The images were evaluated with
computer-aided detection.

[L CC synth-2D]
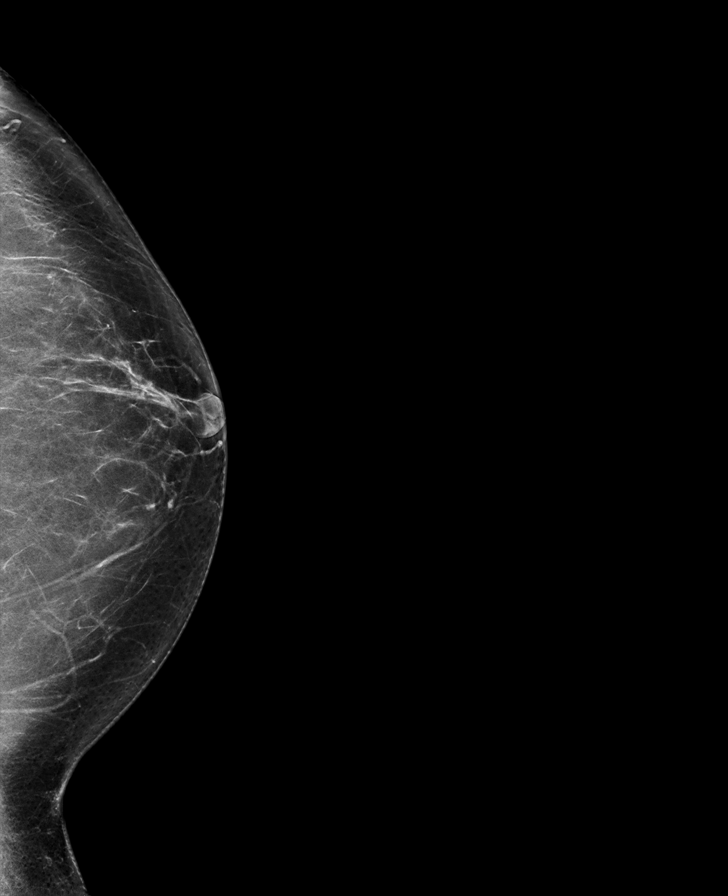

[R MLO synth-2D]
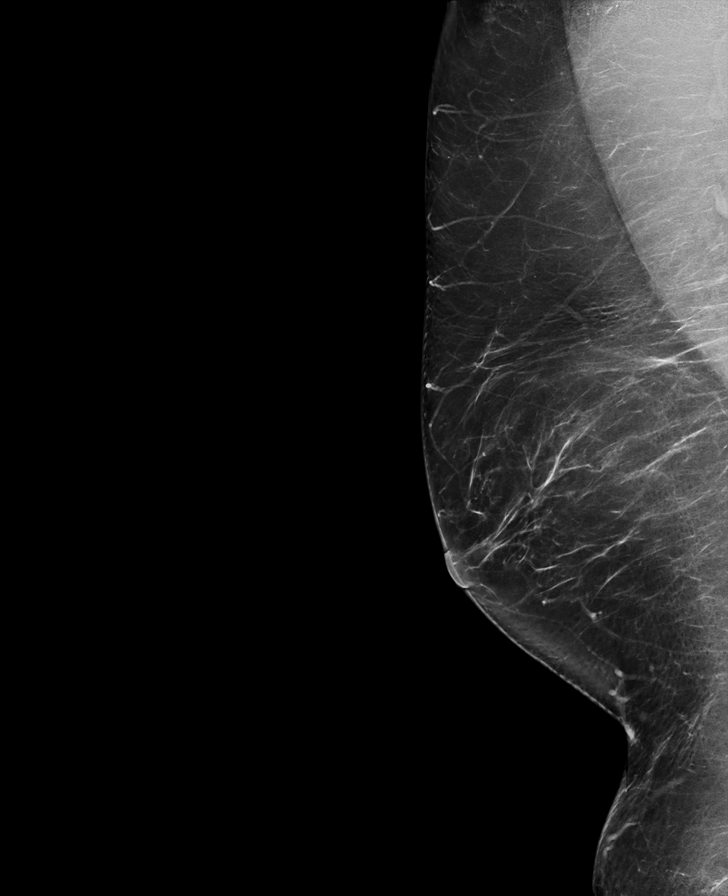

[R CC synth-2D]
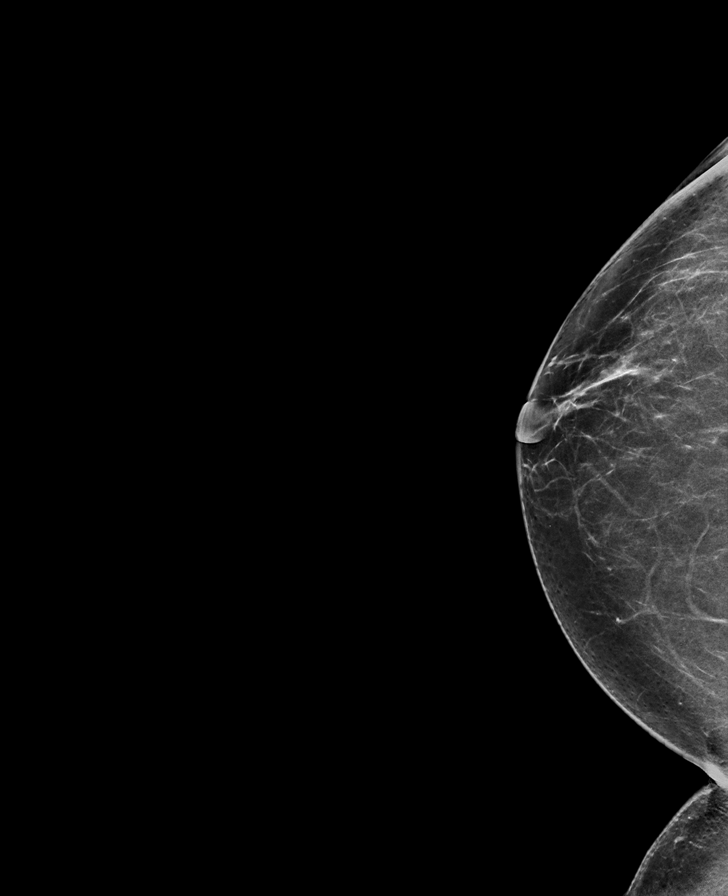

[L MLO synth-2D]
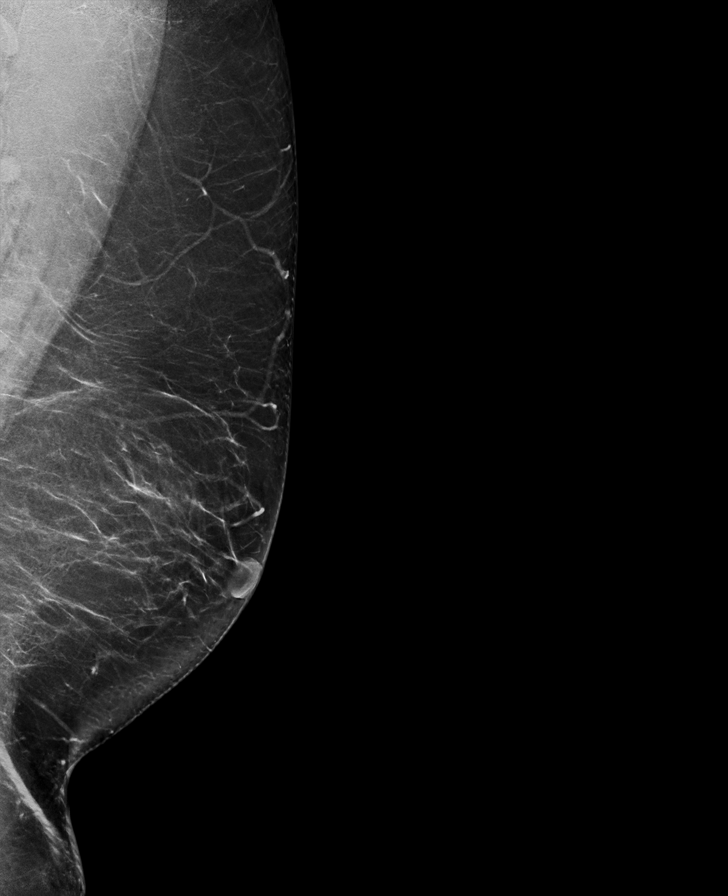

[R CC tomo · tomo slice 35/68.0]
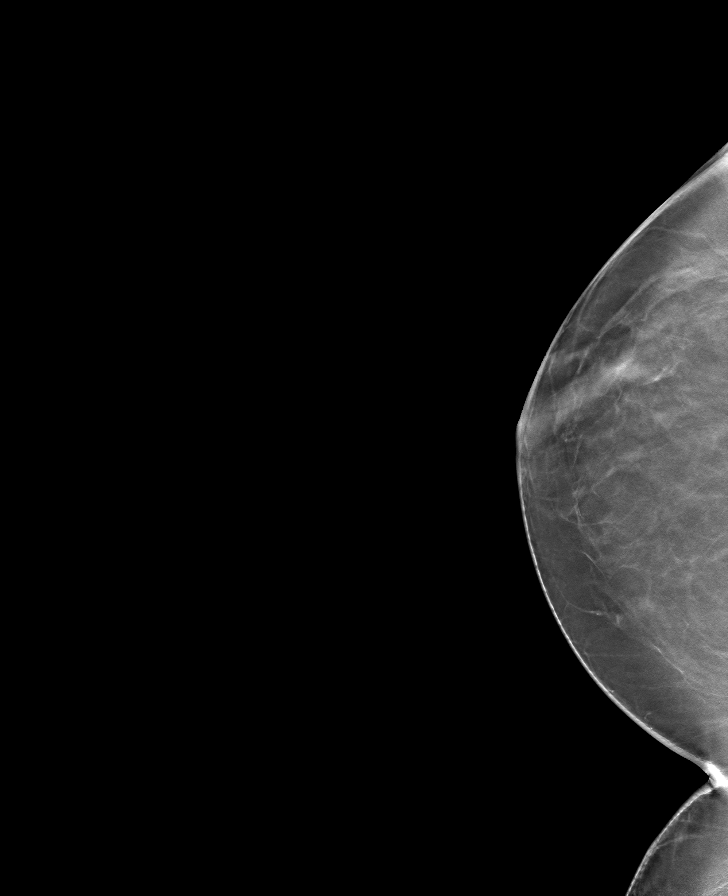

[R MLO tomo · tomo slice 41/80.0]
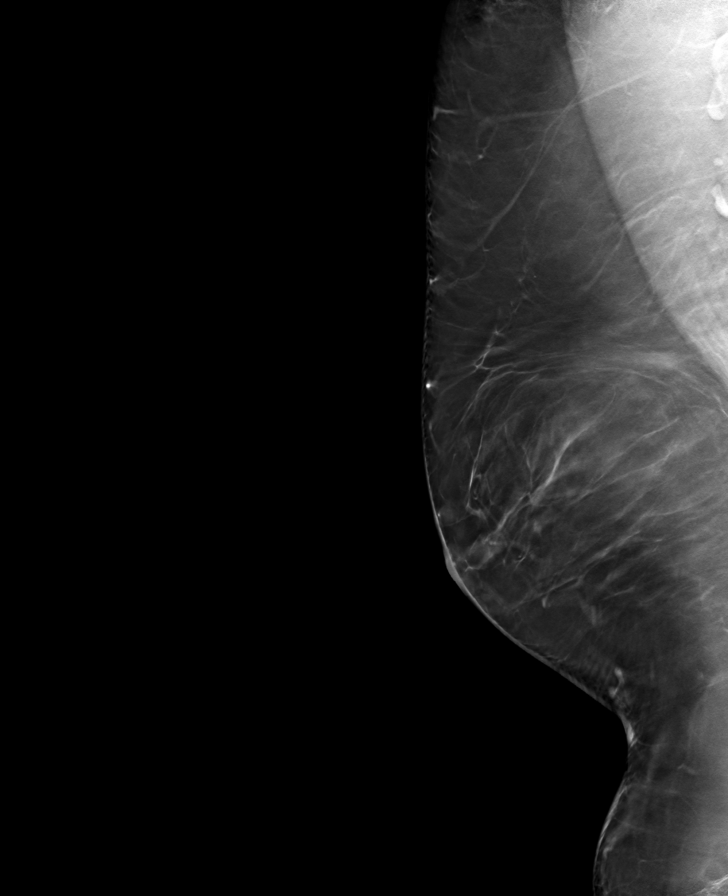

[L CC tomo · tomo slice 37/74.0]
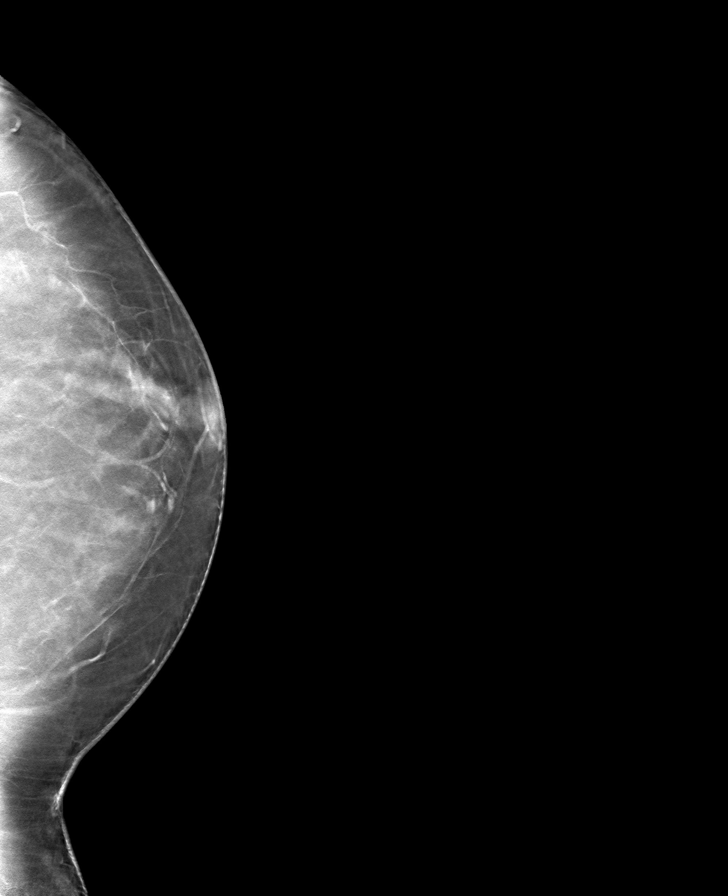

[L MLO tomo · tomo slice 37/74.0]
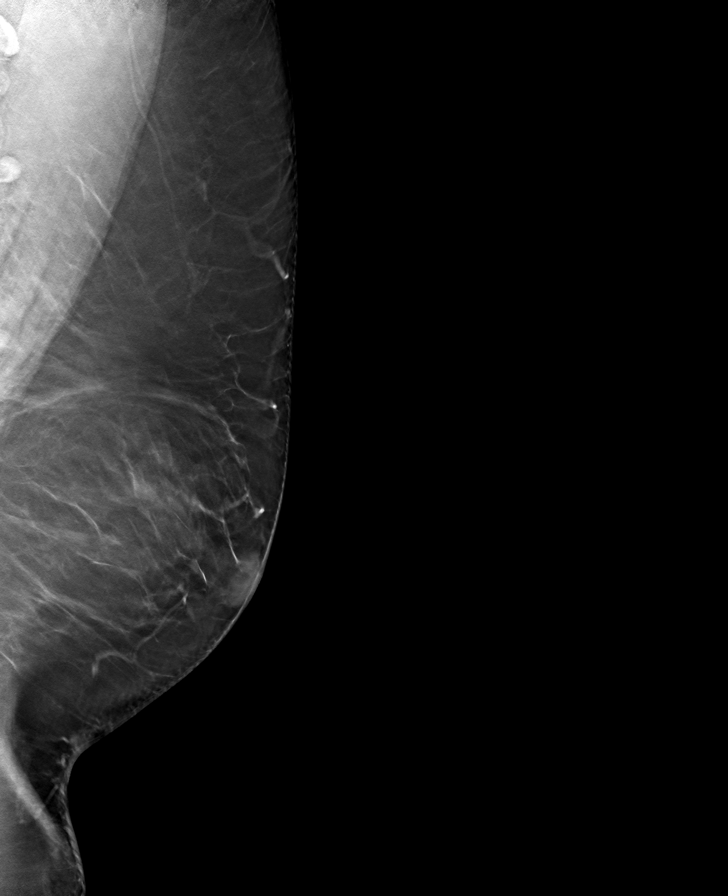

[8 of 24 positions shown; findings below may reference images not displayed]

ACR Breast Density Category b: There are scattered areas of
fibroglandular density.
FINDINGS: There are no findings suspicious for malignancy.
IMPRESSION: No mammographic evidence of malignancy. A result letter of this
screening mammogram will be mailed directly to the patient.

RECOMMENDATION:
Screening mammogram in one year. (Code:XG-X-X7B)

BI-RADS CATEGORY  1: Negative.

## 2022-10-07 ENCOUNTER — Telehealth: Payer: Self-pay | Admitting: Family Medicine

## 2022-10-07 ENCOUNTER — Other Ambulatory Visit: Payer: Self-pay

## 2022-10-07 DIAGNOSIS — Z3041 Encounter for surveillance of contraceptive pills: Secondary | ICD-10-CM

## 2022-10-07 NOTE — Telephone Encounter (Signed)
Pt physical is due on Jan. 14th and was scheduled for 1/17th (due to holidays), BUT she only has 2wks supply of BC.

## 2022-10-11 ENCOUNTER — Ambulatory Visit (LOCAL_COMMUNITY_HEALTH_CENTER): Payer: Self-pay | Admitting: Family Medicine

## 2022-10-11 DIAGNOSIS — Z309 Encounter for contraceptive management, unspecified: Secondary | ICD-10-CM

## 2022-10-11 DIAGNOSIS — Z3041 Encounter for surveillance of contraceptive pills: Secondary | ICD-10-CM

## 2022-10-11 MED ORDER — NORGESTIMATE-ETH ESTRADIOL 0.25-35 MG-MCG PO TABS: 1.0000 | ORAL_TABLET | Freq: Every day | ORAL | 0 refills | Status: AC

## 2022-10-11 NOTE — Progress Notes (Signed)
Seen by FNP Lowella Petties. Dispensed to pt. ORTHO-CYCLEN, education given and questions answered.

## 2022-10-11 NOTE — Progress Notes (Signed)
Patient given 1 packet for OCPs. Physical on 10/26/22

## 2022-10-26 ENCOUNTER — Ambulatory Visit (LOCAL_COMMUNITY_HEALTH_CENTER): Payer: Self-pay | Admitting: Nurse Practitioner

## 2022-10-26 VITALS — BP 122/81 | Ht 62.0 in | Wt 203.0 lb

## 2022-10-26 DIAGNOSIS — Z01419 Encounter for gynecological examination (general) (routine) without abnormal findings: Secondary | ICD-10-CM

## 2022-10-26 DIAGNOSIS — Z30430 Encounter for insertion of intrauterine contraceptive device: Secondary | ICD-10-CM

## 2022-10-26 DIAGNOSIS — Z3009 Encounter for other general counseling and advice on contraception: Secondary | ICD-10-CM

## 2022-10-26 MED ORDER — PARAGARD INTRAUTERINE COPPER IU IUD
1.0000 | INTRAUTERINE_SYSTEM | Freq: Once | INTRAUTERINE | Status: AC
  Administered 2022-10-26: 1 via INTRAUTERINE

## 2022-10-26 NOTE — Progress Notes (Signed)
Pt is her for PE, Pap and IUD insertion.  Paragard placed by Provider without any complications.  Pt given FP packet.  Windle Guard, RN

## 2022-10-26 NOTE — Progress Notes (Signed)
[facility] DEPARTMENT [facility] [address] Main Number: [phone]    Family Planning Visit- Initial Visit  Subjective:  Sabrina Wagner is a 47 y.o.  [id number]   being seen today for an initial annual visit and to discuss reproductive life planning.  The patient is currently using Oral Contraceptive for pregnancy prevention. Patient reports   does not want a pregnancy in the next year.     report they are looking for a method that provides High efficacy at preventing pregnancy  Patient has the following medical conditions has GDM, class A1 and RUQ abdominal mass on their problem list.  Chief Complaint  Patient presents with   Contraception    Physical pap and birth control    Patient reports to clinic today for a physical and would like to discuss switching her birth control method.   Patient desires an IUD.    Body mass index is 37.13 kg/m. - Patient is eligible for diabetes screening based on BMI> 25 and age >35?  yes HA1C ordered? yes  Patient reports 1  partner/s in last year. Desires STI screening?  No - refused  Has patient been screened once for HCV in the past?  No   Does the patient have current drug use (including MJ), have a partner with drug use, and/or has been incarcerated since last result? No  If yes-- Screen for HCV through [facility]   Does the patient meet criteria for HBV testing? No  Criteria:  -Household, sexual or needle sharing contact with HBV -History of drug use -HIV positive -Those with known Hep C   Health Maintenance Due  Topic Date Due   COVID-19 Vaccine (1) Never done   HIV Screening  Never done   Hepatitis C Screening  Never done   PAP SMEAR-Modifier  Never done   COLONOSCOPY (Pts 45-92yrs Insurance coverage will need to be confirmed)  Never done   INFLUENZA VACCINE  Never done    Review of Systems  Constitutional:  Negative for chills, fever, malaise/fatigue and weight loss.   HENT:  Negative for congestion, hearing loss and sore throat.   Eyes:  Negative for blurred vision, double vision and photophobia.  Respiratory:  Negative for shortness of breath.   Cardiovascular:  Negative for chest pain.  Gastrointestinal:  Negative for abdominal pain, blood in stool, constipation, diarrhea, heartburn, nausea and vomiting.  Genitourinary:  Negative for dysuria and frequency.  Musculoskeletal:  Negative for back pain, joint pain and neck pain.  Skin:  Negative for itching and rash.  Neurological:  Negative for dizziness, weakness and headaches.  Endo/Heme/Allergies:  Does not bruise/bleed easily.  Psychiatric/Behavioral:  Negative for depression, substance abuse and suicidal ideas.     The following portions of the patient's history were reviewed and updated as appropriate: allergies, current medications, past family history, past medical history, past social history, past surgical history and problem list. Problem list updated.   See flowsheet for other program required questions.  Objective:   Vitals:   10/26/22 1001  BP: 122/81  Weight: 203 lb (92.1 kg)  Height: 5\' 2"  (1.575 m)    Physical Exam Constitutional:      Appearance: Normal appearance.  HENT:     Head: Normocephalic. No abrasion, masses or laceration. Hair is normal.     Jaw: No tenderness or swelling.     Right Ear: External ear normal.     Left Ear: External ear normal.  Nose: Nose normal.     Mouth/Throat:     Lips: Pink. No lesions.     Mouth: Mucous membranes are moist. No lacerations or oral lesions.     Dentition: No dental caries.     Tongue: No lesions.     Palate: No mass and lesions.     Pharynx: No pharyngeal swelling, oropharyngeal exudate, posterior oropharyngeal erythema or uvula swelling.     Tonsils: No tonsillar exudate or tonsillar abscesses.  Neck:     Thyroid: No thyroid mass, thyromegaly or thyroid tenderness.  Cardiovascular:     Rate and Rhythm: Normal rate  and regular rhythm.  Pulmonary:     Effort: Pulmonary effort is normal.     Breath sounds: Normal breath sounds.  Chest:  Breasts:    Right: Normal. No swelling, mass, nipple discharge, skin change or tenderness.     Left: Normal. No swelling, mass, nipple discharge, skin change or tenderness.  Abdominal:     General: Abdomen is flat. Bowel sounds are normal.     Palpations: Abdomen is soft.     Tenderness: There is no abdominal tenderness. There is no rebound.  Genitourinary:    Pubic Area: No rash or pubic lice.      Labia:        Right: No rash, tenderness or lesion.        Left: No rash, tenderness or lesion.      Vagina: Normal. No vaginal discharge, erythema, tenderness or lesions.     Cervix: Erythema present. No cervical motion tenderness, discharge or lesion.     Uterus: Normal.      Adnexa:        Right: No tenderness.         Left: No tenderness.       Rectum: Normal.     Comments: Amount Discharge: small  Odor: No Adheres to vaginal wall: No Color: Sabrina Wagner  Musculoskeletal:     Cervical back: Full passive range of motion without pain and normal range of motion.  Lymphadenopathy:     Cervical: No cervical adenopathy.     Right cervical: No superficial, deep or posterior cervical adenopathy.    Left cervical: No superficial, deep or posterior cervical adenopathy.     Upper Body:     Right upper body: No supraclavicular, axillary or epitrochlear adenopathy.     Left upper body: No supraclavicular, axillary or epitrochlear adenopathy.     Lower Body: No right inguinal adenopathy. No left inguinal adenopathy.  Skin:    General: Skin is warm and dry.     Findings: No erythema, laceration, lesion or rash.  Neurological:     Mental Status: She is alert and oriented to person, place, and time.  Psychiatric:        Attention and Perception: Attention normal.        Mood and Affect: Mood normal.        Speech: Speech normal.        Behavior: Behavior normal. Behavior is  cooperative.       Assessment and Plan:  Carigan Lister is a 47 y.o. female presenting to the [facility] Department for an initial annual wellness/contraceptive visit  Contraception counseling: Reviewed options based on patient desire and reproductive life plan. Patient is interested in IUD or IUS. This was provided to the patient today.  Risks, benefits, and typical effectiveness rates were reviewed.  Questions were answered.  Written information was also given to the  patient to review.    The patient will follow up in  1 years for surveillance.  The patient was told to call with any further questions, or with any concerns about this method of contraception.  Emphasized use of condoms 100% of the time for STI prevention.  Need for ECP was assessed.  ECP not offered due to continued use of a birth control method.   1. Family planning counseling -47 year old female in clinic today for a physical and change in her birth control method.  Patient desires an IUD today.  -ROS reviewed, no complaints noted. -Patient agrees to Hgb A1C today.  -CT/GC completed prior to IUD insertion.  Declines all other STD screenings.   - Hemoglobin A1C - Chlamydia/Gonorrhea [facility]  2. Well woman exam with routine gynecological exam -Normal well woman exam. -CBE today, next due 10/2023 -PAP performed today.   - IGP, Aptima HPV  3. Encounter for IUD insertion Patient presented to [facility] for IUD insertion. Her GC/CT screening was found to be up to date and using WHO criteria we can be reasonably certain she is not pregnant.  See Flowsheet for IUD check list  IUD Insertion Procedure Note Patient identified, informed consent performed, consent signed.   Discussed risks of irregular bleeding, cramping, infection, malpositioning or misplacement of the IUD outside the uterus which may require further procedure such as laparoscopy. Time out was performed.    Speculum placed in the vagina.   Cervix visualized.  Cleaned with Betadine x 2.  Grasped anteriorly with a single tooth tenaculum.  Uterus sounded to 8 cm.  IUD placed per manufacturer's recommendations.  Strings trimmed to 3 cm. Tenaculum was removed, good hemostasis noted.  Patient tolerated procedure well.   Patient was given post-procedure instructions- both agency handout and verbally by provider.  She was advised to have backup contraception for one week.  Patient was also asked to check IUD strings periodically or follow up in 4 weeks for IUD check.   - paragard intrauterine copper IUD 1 each  Due to a language barrier an interpreter [phone]) was used for the provider portion of the visit.      Total time spent: 45 minutes   Return in about 1 year (around 10/27/2023) for Annual well-woman exam.    Gregary Cromer, FNP

## 2022-10-27 LAB — HEMOGLOBIN A1C
Est. average glucose Bld gHb Est-mCnc: 134 mg/dL
Hgb A1c MFr Bld: 6.3 % — ABNORMAL HIGH (ref 4.8–5.6)

## 2022-11-03 LAB — IGP, APTIMA HPV
HPV Aptima: NEGATIVE
PAP Smear Comment: 0

## 2022-12-09 ENCOUNTER — Other Ambulatory Visit: Payer: Self-pay | Admitting: Primary Care

## 2022-12-09 DIAGNOSIS — N289 Disorder of kidney and ureter, unspecified: Secondary | ICD-10-CM

## 2022-12-13 NOTE — Progress Notes (Signed)
Pap card mailed to patient with pap results and recommendation from [facility], NP, to repeat in 5 years.

## 2023-01-27 ENCOUNTER — Ambulatory Visit
Admission: RE | Admit: 2023-01-27 | Discharge: 2023-01-27 | Disposition: A | Discharge status: Home / Self Care | Payer: Self-pay | Source: Ambulatory Visit | Attending: Primary Care | Admitting: Primary Care

## 2023-01-27 DIAGNOSIS — N289 Disorder of kidney and ureter, unspecified: Secondary | ICD-10-CM | POA: Insufficient documentation

## 2024-05-07 ENCOUNTER — Other Ambulatory Visit: Payer: Self-pay | Admitting: Primary Care

## 2024-05-07 DIAGNOSIS — N289 Disorder of kidney and ureter, unspecified: Secondary | ICD-10-CM

## 2024-05-08 ENCOUNTER — Telehealth: Payer: Self-pay | Admitting: *Deleted

## 2024-05-15 ENCOUNTER — Ambulatory Visit
Admission: RE | Admit: 2024-05-15 | Discharge: 2024-05-15 | Disposition: A | Discharge status: Home / Self Care | Payer: Self-pay | Source: Ambulatory Visit | Attending: Primary Care | Admitting: Primary Care

## 2024-05-15 DIAGNOSIS — N289 Disorder of kidney and ureter, unspecified: Secondary | ICD-10-CM | POA: Insufficient documentation

## 2024-05-29 ENCOUNTER — Other Ambulatory Visit: Payer: Self-pay | Admitting: Primary Care

## 2024-05-29 DIAGNOSIS — Z1231 Encounter for screening mammogram for malignant neoplasm of breast: Secondary | ICD-10-CM

## 2024-06-04 ENCOUNTER — Other Ambulatory Visit: Payer: Self-pay | Admitting: Primary Care

## 2024-06-04 DIAGNOSIS — N289 Disorder of kidney and ureter, unspecified: Secondary | ICD-10-CM

## 2024-06-07 ENCOUNTER — Ambulatory Visit
Admission: RE | Admit: 2024-06-07 | Discharge: 2024-06-07 | Disposition: A | Discharge status: Home / Self Care | Payer: Self-pay | Source: Ambulatory Visit | Attending: Primary Care | Admitting: Primary Care

## 2024-06-07 DIAGNOSIS — N289 Disorder of kidney and ureter, unspecified: Secondary | ICD-10-CM | POA: Insufficient documentation

## 2024-06-07 MED ORDER — GADOBUTROL 1 MMOL/ML IV SOLN
9.0000 mL | Freq: Once | INTRAVENOUS | Status: AC | PRN
  Administered 2024-06-07: 9 mL via INTRAVENOUS

## 2024-06-21 ENCOUNTER — Ambulatory Visit
Admission: RE | Admit: 2024-06-21 | Discharge: 2024-06-21 | Disposition: A | Discharge status: Home / Self Care | Payer: Self-pay | Source: Ambulatory Visit | Attending: Primary Care | Admitting: Primary Care

## 2024-06-21 DIAGNOSIS — Z1231 Encounter for screening mammogram for malignant neoplasm of breast: Secondary | ICD-10-CM | POA: Insufficient documentation
